# Patient Record
Sex: Female | Born: 1975 | Race: White | Hispanic: No | Smoking: Current every day smoker
Health system: Southern US, Community
[De-identification: ages and names within clinical notes are randomized; demographics above are authoritative.]

## PROBLEM LIST (undated history)

## (undated) DIAGNOSIS — W3400XA Accidental discharge from unspecified firearms or gun, initial encounter: Secondary | ICD-10-CM

## (undated) DIAGNOSIS — R87619 Unspecified abnormal cytological findings in specimens from cervix uteri: Secondary | ICD-10-CM

## (undated) DIAGNOSIS — F329 Major depressive disorder, single episode, unspecified: Secondary | ICD-10-CM

## (undated) DIAGNOSIS — G8929 Other chronic pain: Secondary | ICD-10-CM

## (undated) DIAGNOSIS — F32A Depression, unspecified: Secondary | ICD-10-CM

## (undated) DIAGNOSIS — F419 Anxiety disorder, unspecified: Secondary | ICD-10-CM

## (undated) HISTORY — PX: MINOR AMPUTATION OF DIGIT: SHX6242

## (undated) HISTORY — PX: APPENDECTOMY: SHX54

## (undated) HISTORY — PX: OTHER SURGICAL HISTORY: SHX169

---

## 2011-05-05 ENCOUNTER — Emergency Department (HOSPITAL_BASED_OUTPATIENT_CLINIC_OR_DEPARTMENT_OTHER)
Admission: EM | Admit: 2011-05-05 | Discharge: 2011-05-05 | Disposition: A | Discharge status: Home / Self Care | Payer: Self-pay | Attending: Emergency Medicine | Admitting: Emergency Medicine

## 2011-05-05 DIAGNOSIS — B86 Scabies: Secondary | ICD-10-CM | POA: Insufficient documentation

## 2011-05-05 DIAGNOSIS — F17200 Nicotine dependence, unspecified, uncomplicated: Secondary | ICD-10-CM | POA: Insufficient documentation

## 2012-07-05 ENCOUNTER — Emergency Department (HOSPITAL_BASED_OUTPATIENT_CLINIC_OR_DEPARTMENT_OTHER): Payer: Self-pay

## 2012-07-05 ENCOUNTER — Emergency Department (HOSPITAL_BASED_OUTPATIENT_CLINIC_OR_DEPARTMENT_OTHER)
Admission: EM | Admit: 2012-07-05 | Discharge: 2012-07-05 | Disposition: A | Discharge status: Home / Self Care | Payer: Self-pay | Attending: Emergency Medicine | Admitting: Emergency Medicine

## 2012-07-05 ENCOUNTER — Encounter (HOSPITAL_BASED_OUTPATIENT_CLINIC_OR_DEPARTMENT_OTHER): Payer: Self-pay

## 2012-07-05 DIAGNOSIS — R109 Unspecified abdominal pain: Secondary | ICD-10-CM

## 2012-07-05 DIAGNOSIS — A499 Bacterial infection, unspecified: Secondary | ICD-10-CM | POA: Insufficient documentation

## 2012-07-05 DIAGNOSIS — Z9089 Acquired absence of other organs: Secondary | ICD-10-CM | POA: Insufficient documentation

## 2012-07-05 DIAGNOSIS — N760 Acute vaginitis: Secondary | ICD-10-CM | POA: Insufficient documentation

## 2012-07-05 DIAGNOSIS — R102 Pelvic and perineal pain: Secondary | ICD-10-CM

## 2012-07-05 DIAGNOSIS — B9689 Other specified bacterial agents as the cause of diseases classified elsewhere: Secondary | ICD-10-CM | POA: Insufficient documentation

## 2012-07-05 DIAGNOSIS — F17200 Nicotine dependence, unspecified, uncomplicated: Secondary | ICD-10-CM | POA: Insufficient documentation

## 2012-07-05 HISTORY — DX: Unspecified abnormal cytological findings in specimens from cervix uteri: R87.619

## 2012-07-05 LAB — CBC WITH DIFFERENTIAL/PLATELET
Eosinophils Absolute: 0.5 10*3/uL (ref 0.0–0.7)
Eosinophils Relative: 7 % — ABNORMAL HIGH (ref 0–5)
Hemoglobin: 12.3 g/dL (ref 12.0–15.0)
Lymphocytes Relative: 34 % (ref 12–46)
Lymphs Abs: 2.6 10*3/uL (ref 0.7–4.0)
MCH: 33 pg (ref 26.0–34.0)
MCV: 95.2 fL (ref 78.0–100.0)
Monocytes Relative: 8 % (ref 3–12)
Neutrophils Relative %: 51 % (ref 43–77)
RBC: 3.73 MIL/uL — ABNORMAL LOW (ref 3.87–5.11)
WBC: 7.5 10*3/uL (ref 4.0–10.5)

## 2012-07-05 LAB — PREGNANCY, URINE: Preg Test, Ur: NEGATIVE

## 2012-07-05 LAB — COMPREHENSIVE METABOLIC PANEL
Alkaline Phosphatase: 56 U/L (ref 39–117)
BUN: 9 mg/dL (ref 6–23)
CO2: 24 mEq/L (ref 19–32)
GFR calc Af Amer: >90 mL/min (ref >90)
GFR calc non Af Amer: >90 mL/min (ref >90)
Glucose, Bld: 88 mg/dL (ref 70–99)
Potassium: 3.4 mEq/L — ABNORMAL LOW (ref 3.5–5.1)
Total Protein: 6.9 g/dL (ref 6.0–8.3)

## 2012-07-05 LAB — URINALYSIS, ROUTINE W REFLEX MICROSCOPIC
Leukocytes, UA: NEGATIVE
Nitrite: NEGATIVE
Protein, ur: NEGATIVE mg/dL
Urobilinogen, UA: 1 mg/dL (ref 0.0–1.0)

## 2012-07-05 LAB — WET PREP, GENITAL: Yeast Wet Prep HPF POC: NONE SEEN

## 2012-07-05 MED ORDER — MORPHINE SULFATE 4 MG/ML IJ SOLN
4.0000 mg | Freq: Once | INTRAMUSCULAR | Status: AC
  Administered 2012-07-05: 4 mg via INTRAVENOUS
  Filled 2012-07-05: qty 1

## 2012-07-05 MED ORDER — MORPHINE SULFATE 4 MG/ML IJ SOLN
4.0000 mg | Freq: Once | INTRAMUSCULAR | Status: AC
  Administered 2012-07-05: 4 mg via INTRAMUSCULAR
  Filled 2012-07-05: qty 1

## 2012-07-05 MED ORDER — IOHEXOL 300 MG/ML  SOLN
100.0000 mL | Freq: Once | INTRAMUSCULAR | Status: AC | PRN
  Administered 2012-07-05: 100 mL via INTRAVENOUS

## 2012-07-05 MED ORDER — IBUPROFEN 600 MG PO TABS: 600.0000 mg | ORAL_TABLET | Freq: Four times a day (QID) | ORAL | Status: AC | PRN

## 2012-07-05 MED ORDER — METRONIDAZOLE 500 MG PO TABS: 500.0000 mg | ORAL_TABLET | Freq: Two times a day (BID) | ORAL | Status: AC

## 2012-07-05 MED ORDER — SODIUM CHLORIDE 0.9 % IV BOLUS (SEPSIS)
1000.0000 mL | Freq: Once | INTRAVENOUS | Status: AC
  Administered 2012-07-05: 1000 mL via INTRAVENOUS

## 2012-07-05 MED ORDER — ONDANSETRON 4 MG PO TBDP
4.0000 mg | ORAL_TABLET | Freq: Once | ORAL | Status: AC
  Administered 2012-07-05: 4 mg via ORAL
  Filled 2012-07-05: qty 1

## 2012-07-05 MED ORDER — IOHEXOL 300 MG/ML  SOLN: 20.0000 mL | INTRAMUSCULAR | Status: DC

## 2012-07-05 MED ORDER — OXYCODONE-ACETAMINOPHEN 5-325 MG PO TABS: 2.0000 | ORAL_TABLET | ORAL | Status: AC | PRN

## 2012-07-05 NOTE — ED Notes (Addendum)
Hx of abnormal cervical cells, "four freezes, one lube, scraped of my uterus"-was advised to have pap 2/yr-did not have pap x 5 yrs-last pap Dec 2012 at [facility] in [place]-was not contacted with reports-called today due to pelvic pain-was not given results but advised to go to ED-pt reports intermittent pelvic-worse since this am-nausea

## 2012-07-05 NOTE — ED Provider Notes (Addendum)
History     CSN: [id number]  Arrival date & time 07/05/12  1150   First MD Initiated Contact with Patient 07/05/12 1203      Chief Complaint  Patient presents with  . Pelvic Pain    (Consider location/radiation/quality/duration/timing/severity/associated sxs/prior treatment) HPI  36yoF pw lower abdominal, lower and diffuse, has been constant since awakening. C/o 8/10 pain which she describes as pressure "feels like it's coming out". Feels "almost like a contraction except it's constant".  Baseline vaginal discharge without odor or itching.  H/o abnormal pap smears last LEEP at 36yo per patient. Has not f/u since Dec 2012 (states she could not obtain the result of her pap smear done at that time). Denies vaginal bleeding. Denies hematuria/dysuria/freq/urgency. Denies back pain. +nausea only when pain is severe. NBNB emesis x 2.  Denies fever +chills. Diarrhea began yesterday, loose stool. No unintentional weight loss. H/o trichomoniasis in the past. In a monogamous relationship.  Abdominal surgeries include appendectomy, removal of uterine cyst (lap per pt)   ED Notes, ED Provider Notes from 07/05/12 0000 to 07/05/12 12:09:02       Angeline Slim, RN 07/05/2012 12:06      Hx of abnormal cervical cells, "four freezes, one lube, scraped the whole all of my uterus"-was advised to have pap 2/yr-did not have pap x 5 yrs-last pap Dec 2012 at [facility] in [place]-was not contacted with reports-called today due to pelvic pain-was not given results but advised to go to ED-pt reports intermittent pelvic-worse since this am-nausea    Past Medical History  Diagnosis Date  . Abnormal cervical Papanicolaou smear     Past Surgical History  Procedure Date  . Uterine cyst   . Appendectomy     No family history on file.  History  Substance Use Topics  . Smoking status: Current Everyday Smoker  . Smokeless tobacco: Not on file  . Alcohol Use: No    OB History    Grav Para Term Preterm Abortions TAB  SAB Ect Mult Living                  Review of Systems  All other systems reviewed and are negative.  except as noted HPI   Allergies  Review of patient's allergies indicates no known allergies.  Home Medications   Current Outpatient Rx  Name Route Sig Dispense Refill  . GOODY HEADACHE PO Oral Take 1 packet by mouth daily as needed. Patient used this medication for her headaches.    . IBUPROFEN 600 MG PO TABS Oral Take 1 tablet (600 mg total) by mouth every 6 (six) hours as needed for pain. 30 tablet 0  . METRONIDAZOLE 500 MG PO TABS Oral Take 1 tablet (500 mg total) by mouth 2 (two) times daily. 14 tablet 0  . OXYCODONE-ACETAMINOPHEN 5-325 MG PO TABS Oral Take 2 tablets by mouth every 4 (four) hours as needed for pain. 6 tablet 0    BP 119/84  Pulse 66  Temp 98 F (36.7 C) (Oral)  Resp 16  Ht 5\' 9"  (1.753 m)  Wt 190 lb (86.183 kg)  BMI 28.06 kg/m2  SpO2 100%  LMP 06/15/2012  Physical Exam  Nursing note and vitals reviewed. Constitutional: She is oriented to person, place, and time. She appears well-developed.  HENT:  Head: Atraumatic.       Mm dry  Eyes: Conjunctivae and EOM are normal. Pupils are equal, round, and reactive to light.  Neck: Normal range of motion. Neck  supple.  Cardiovascular: Normal rate, regular rhythm, normal heart sounds and intact distal pulses.   Pulmonary/Chest: Effort normal and breath sounds normal. No respiratory distress. She has no wheezes. She has no rales.  Abdominal: Soft. She exhibits no distension. There is tenderness. There is no rebound and no guarding.       +diffuse lower abd ttp > RLQ  Genitourinary:       Min clear /white vaginal discharge No CMT Min R adnexal ttp Two white IUD strings visualized No uterine prolapse in supine position  Musculoskeletal: Normal range of motion.  Neurological: She is alert and oriented to person, place, and time.  Skin: Skin is warm and dry. No rash noted.  Psychiatric: She has a normal  mood and affect.    ED Course  Procedures (including critical care time)  Labs Reviewed  URINALYSIS, ROUTINE W REFLEX MICROSCOPIC - Abnormal; Notable for the following:    Color, Urine AMBER (*)  BIOCHEMICALS MAY BE AFFECTED BY COLOR   APPearance CLOUDY (*)     Bilirubin Urine SMALL (*)     Ketones, ur 15 (*)     All other components within normal limits  WET PREP, GENITAL - Abnormal; Notable for the following:    Clue Cells Wet Prep HPF POC FEW (*)     WBC, Wet Prep HPF POC TOO NUMEROUS TO COUNT (*)     All other components within normal limits  CBC WITH DIFFERENTIAL - Abnormal; Notable for the following:    RBC 3.73 (*)     HCT 35.5 (*)     Eosinophils Relative 7 (*)     All other components within normal limits  COMPREHENSIVE METABOLIC PANEL - Abnormal; Notable for the following:    Potassium 3.4 (*)     All other components within normal limits  PREGNANCY, URINE  GC/CHLAMYDIA PROBE AMP, GENITAL   US Transvaginal Non-ob  07/05/2012  *RADIOLOGY REPORT*  Clinical Data: Pelvic pain.  IUD.  History of ovarian cysts.  TRANSABDOMINAL AND TRANSVAGINAL ULTRASOUND OF PELVIS  Technique:  Both transabdominal and transvaginal ultrasound examinations of the pelvis were performed including evaluation of the uterus, ovaries, adnexal regions, and pelvic cul-de-sac.  Comparison: None.  Findings:  Uterus: 8.3 x 4.0 x 4.9 cm.  Normal echotexture.  No focal abnormality.  Endometrium: 4 mm in thickness.  IUD noted within the endometrial canal.  Right Ovary: Not visualized.  No adnexal masses seen.  Left Ovary: 2.6 x 1.6 x 1.9 cm. Normal size and echotexture.  No adnexal masses.  Other Findings:  No free fluid.  IMPRESSION: IUD in place within the endometrial canal.  Nonvisualization of the right ovary.  No adnexal masses noted bilaterally.  No acute findings.  Original Report Authenticated By: Cyndie Chime, M.D.   US Pelvis Complete  07/05/2012  *RADIOLOGY REPORT*  Clinical Data: Pelvic pain.  IUD.   History of ovarian cysts.  TRANSABDOMINAL AND TRANSVAGINAL ULTRASOUND OF PELVIS  Technique:  Both transabdominal and transvaginal ultrasound examinations of the pelvis were performed including evaluation of the uterus, ovaries, adnexal regions, and pelvic cul-de-sac.  Comparison: None.  Findings:  Uterus: 8.3 x 4.0 x 4.9 cm.  Normal echotexture.  No focal abnormality.  Endometrium: 4 mm in thickness.  IUD noted within the endometrial canal.  Right Ovary: Not visualized.  No adnexal masses seen.  Left Ovary: 2.6 x 1.6 x 1.9 cm. Normal size and echotexture.  No adnexal masses.  Other Findings:  No free fluid.  IMPRESSION: IUD in place within the endometrial canal.  Nonvisualization of the right ovary.  No adnexal masses noted bilaterally.  No acute findings.  Original Report Authenticated By: Cyndie Chime, M.D.   Ct Abdomen Pelvis W Contrast  07/05/2012  *RADIOLOGY REPORT*  Clinical Data: 36 year old female with lower abdominal pain nausea and vomiting.  CT ABDOMEN AND PELVIS WITH CONTRAST  Technique:  Multidetector CT imaging of the abdomen and pelvis was performed following the standard protocol during bolus administration of intravenous contrast.  Contrast: OMNIPAQUE IOHEXOL 300 MG/ML  SOLN  Comparison: Pelvic ultrasound from the same day.  Findings: Minor scarring or atelectasis at the lung bases.  No pericardial or pleural effusion.  Minimal No acute osseous abnormality identified.  No pelvic free fluid.  IUD in place with no adverse features. Uterus and adnexa within normal limits otherwise.  Bladder decompressed.  Negative distal colon.  Negative proximal colon.  Normal terminal ileum.  Appendix not identified and favored to be surgically absent.  Oral contrast has not reached the ileum.  No dilated small bowel.  Stomach distended with contrast, otherwise within normal limits.  Negative duodenum, liver, gallbladder, spleen, pancreas and adrenal glands.  Portal venous system within normal limits.   There is some atherosclerosis of the distal aorta and iliac arteries, but major arterial structures are patent.  Kidneys are within normal limits; there are small caliber venous collaterals in the left pararenal space which probably are insignificant.  No abdominal free fluid. No lymphadenopathy identified.  IMPRESSION: No acute or inflammatory findings.  Original Report Authenticated By: Ulla Potash III, M.D.    1. Abdominal pain   2. Pelvic pain in female   3. Bacterial vaginosis     MDM  Pw lower abdominal pain >RLQ. She has min R adnexal ttp and min vaginal discharge. No prolapse.  H/o appendicitis s/p appendectomy, doubt stump appendicitis. Possible colitis given point tenderness and diarrhea. U/S pelvis as above with IUD in good placement, no large TOA visualized. No large cancerous lesion. I have low suspicion of torsion based on clinical picture but they were unable to visualize R ovary. Possible cyst. Pt denies oophorectomy. Doubt PID without CMT. She does cont to have significant RLQ point tenderness requiring narcotic pain medication. Labs unremarkable except clue cells, which we will treat with flagyl. IVF, zofran, pain control. CT AP pending.    Old medical records obtained. PAP 11/2011 negative for HPV. Patient aware that she will need to f/u for PAP smear 11/2012 as previously suggested by her providers. Aware of above results and that torsion cannot be fully ruled out. I did speak with the radiologist who states he could visualize her Rt ovary high in the pelvis and no inflammatory changes associated. She is having no pain at this time and would like to go home. No EMC precluding discharge at this time. Given Precautions for return. PMD f/u.   Forbes Cellar, MD 07/05/12 1610  Forbes Cellar, MD 07/05/12 9604  Forbes Cellar, MD 07/05/12 5409

## 2012-07-05 NOTE — ED Notes (Signed)
Spoke with [facility] medical records-requested pt med record from Dec 2012-release of med records faxed

## 2012-09-24 ENCOUNTER — Emergency Department (HOSPITAL_BASED_OUTPATIENT_CLINIC_OR_DEPARTMENT_OTHER)
Admission: EM | Admit: 2012-09-24 | Discharge: 2012-09-24 | Disposition: A | Discharge status: Home / Self Care | Payer: Self-pay | Attending: Emergency Medicine | Admitting: Emergency Medicine

## 2012-09-24 ENCOUNTER — Encounter (HOSPITAL_BASED_OUTPATIENT_CLINIC_OR_DEPARTMENT_OTHER): Payer: Self-pay | Admitting: *Deleted

## 2012-09-24 DIAGNOSIS — L02214 Cutaneous abscess of groin: Secondary | ICD-10-CM

## 2012-09-24 DIAGNOSIS — L03319 Cellulitis of trunk, unspecified: Secondary | ICD-10-CM | POA: Insufficient documentation

## 2012-09-24 DIAGNOSIS — F17200 Nicotine dependence, unspecified, uncomplicated: Secondary | ICD-10-CM | POA: Insufficient documentation

## 2012-09-24 DIAGNOSIS — L02219 Cutaneous abscess of trunk, unspecified: Secondary | ICD-10-CM | POA: Insufficient documentation

## 2012-09-24 MED ORDER — DOXYCYCLINE HYCLATE 100 MG PO TABS
100.0000 mg | ORAL_TABLET | Freq: Once | ORAL | Status: AC
Start: 2012-09-24 — End: 2012-09-24
  Administered 2012-09-24: 100 mg via ORAL
  Filled 2012-09-24: qty 1

## 2012-09-24 MED ORDER — HYDROCODONE-ACETAMINOPHEN 5-325 MG PO TABS: 2.0000 | ORAL_TABLET | ORAL | Status: AC | PRN

## 2012-09-24 MED ORDER — LIDOCAINE HCL 2 % IJ SOLN
INTRAMUSCULAR | Status: AC
  Filled 2012-09-24: qty 20

## 2012-09-24 MED ORDER — CEPHALEXIN 500 MG PO CAPS: 500.0000 mg | ORAL_CAPSULE | Freq: Four times a day (QID) | ORAL | Status: DC

## 2012-09-24 MED ORDER — HYDROCODONE-ACETAMINOPHEN 5-325 MG PO TABS
2.0000 | ORAL_TABLET | Freq: Once | ORAL | Status: AC
  Administered 2012-09-24: 2 via ORAL
  Filled 2012-09-24: qty 2

## 2012-09-24 MED ORDER — CEPHALEXIN 250 MG PO CAPS
250.0000 mg | ORAL_CAPSULE | Freq: Once | ORAL | Status: AC
  Administered 2012-09-24: 250 mg via ORAL
  Filled 2012-09-24: qty 1

## 2012-09-24 MED ORDER — DOXYCYCLINE HYCLATE 100 MG PO CAPS: 100.0000 mg | ORAL_CAPSULE | Freq: Two times a day (BID) | ORAL | Status: DC

## 2012-09-24 NOTE — ED Notes (Signed)
Instructions for dressing and care given by Tresa Endo EMT, patient verbalized understanding

## 2012-09-24 NOTE — ED Provider Notes (Signed)
Medical screening examination/treatment/procedure(s) were performed by non-physician practitioner and as supervising physician I was immediately available for consultation/collaboration.   Arzella Rehmann, MD 09/24/12 1513 

## 2012-09-24 NOTE — ED Notes (Signed)
abcess pelvic region, has grown worse over the past three weeks, red swollen

## 2012-09-24 NOTE — ED Provider Notes (Signed)
History     CSN: [id number]  Arrival date & time 09/24/12  1053   First MD Initiated Contact with Patient 09/24/12 1223      Chief Complaint  Patient presents with  . Abscess    (Consider location/radiation/quality/duration/timing/severity/associated sxs/prior treatment) Patient is a 36 y.o. female presenting with abscess. The history is provided by the patient. No language interpreter was used.  Abscess  This is a new problem. The onset was gradual. The problem occurs occasionally. The problem has been unchanged. The abscess is present on the genitalia. The problem is severe. There were no sick contacts.   Pt complains of a swollen area to left groin.   Pt reports she tried to drain area with a pin Past Medical History  Diagnosis Date  . Abnormal cervical Papanicolaou smear     Past Surgical History  Procedure Date  . Uterine cyst   . Appendectomy     No family history on file.  History  Substance Use Topics  . Smoking status: Current Every Day Smoker  . Smokeless tobacco: Not on file  . Alcohol Use: No    OB History    Grav Para Term Preterm Abortions TAB SAB Ect Mult Living                  Review of Systems  All other systems reviewed and are negative.    Allergies  Review of patient's allergies indicates no known allergies.  Home Medications   Current Outpatient Rx  Name Route Sig Dispense Refill  . GOODY HEADACHE PO Oral Take 1 packet by mouth daily as needed. Patient used this medication for her headaches.      BP 139/68  Pulse 85  Temp 98.2 F (36.8 C)  Resp 20  SpO2 98%  Physical Exam  Constitutional: She is oriented to person, place, and time. She appears well-developed and well-nourished.  Genitourinary:       Tender swollen left labial area,  Not a bartolins,  Pointing area skin,   (probable skin abscess)   Musculoskeletal: She exhibits tenderness.  Neurological: She is alert and oriented to person, place, and time. She has normal  reflexes.  Skin: There is erythema.  Psychiatric: She has a normal mood and affect.    ED Course  INCISION AND DRAINAGE Date/Time: 09/24/2012 1:01 PM Performed by: Elson Areas Authorized by: Elson Areas Consent: Verbal consent not obtained. Risks and benefits: risks, benefits and alternatives were discussed Consent given by: patient Patient understanding: patient states understanding of the procedure being performed Patient identity confirmed: verbally with patient Type: abscess Anesthesia: local infiltration Patient sedated: no Scalpel size: 11 Incision type: single straight Complexity: simple Wound treatment: wound left open Packing material: 1/4 in iodoform gauze   (including critical care time)  Labs Reviewed - No data to display No results found.   1. Abscess of left groin       MDM  Pt given doxy and keflex here.   Pt given rx for doxy and kefles,  Hydrocodone for pain.    Pt advised to recheck here in 2 day        Elson Areas, [place] 09/24/12 1302  Lonia Skinner [place], [place] 09/24/12 [phone]

## 2014-05-30 ENCOUNTER — Emergency Department (HOSPITAL_BASED_OUTPATIENT_CLINIC_OR_DEPARTMENT_OTHER): Payer: Self-pay

## 2014-05-30 ENCOUNTER — Encounter (HOSPITAL_BASED_OUTPATIENT_CLINIC_OR_DEPARTMENT_OTHER): Payer: Self-pay | Admitting: Emergency Medicine

## 2014-05-30 ENCOUNTER — Emergency Department (HOSPITAL_BASED_OUTPATIENT_CLINIC_OR_DEPARTMENT_OTHER)
Admission: EM | Admit: 2014-05-30 | Discharge: 2014-05-30 | Disposition: A | Discharge status: Home / Self Care | Payer: Self-pay | Attending: Emergency Medicine | Admitting: Emergency Medicine

## 2014-05-30 DIAGNOSIS — J209 Acute bronchitis, unspecified: Secondary | ICD-10-CM | POA: Insufficient documentation

## 2014-05-30 DIAGNOSIS — F17200 Nicotine dependence, unspecified, uncomplicated: Secondary | ICD-10-CM | POA: Insufficient documentation

## 2014-05-30 DIAGNOSIS — Z792 Long term (current) use of antibiotics: Secondary | ICD-10-CM | POA: Insufficient documentation

## 2014-05-30 DIAGNOSIS — H109 Unspecified conjunctivitis: Secondary | ICD-10-CM | POA: Insufficient documentation

## 2014-05-30 DIAGNOSIS — R079 Chest pain, unspecified: Secondary | ICD-10-CM | POA: Insufficient documentation

## 2014-05-30 DIAGNOSIS — J40 Bronchitis, not specified as acute or chronic: Secondary | ICD-10-CM

## 2014-05-30 MED ORDER — POLYMYXIN B-TRIMETHOPRIM 10000-0.1 UNIT/ML-% OP SOLN: 1.0000 [drp] | OPHTHALMIC | Status: DC

## 2014-05-30 MED ORDER — HYDROCODONE-HOMATROPINE 5-1.5 MG/5ML PO SYRP: 5.0000 mL | ORAL_SOLUTION | Freq: Four times a day (QID) | ORAL | Status: DC | PRN

## 2014-05-30 MED ORDER — ALBUTEROL SULFATE HFA 108 (90 BASE) MCG/ACT IN AERS
1.0000 | INHALATION_SPRAY | RESPIRATORY_TRACT | Status: DC | PRN
  Administered 2014-05-30: 2 via RESPIRATORY_TRACT
  Filled 2014-05-30: qty 6.7

## 2014-05-30 NOTE — ED Notes (Signed)
Pt amb to room 5 with quick steady gait in nad. Pt reports "lots of gunk in my eyes" x 2 days, cough producing moderate yellow sputum x 1 week.

## 2014-05-30 NOTE — Discharge Instructions (Signed)
Bacterial Conjunctivitis  Bacterial conjunctivitis (commonly called pink eye) is redness, soreness, or puffiness (inflammation) of the white part of your eye. It is caused by a germ called bacteria. These germs can easily spread from person to person (contagious). Your eye often will become red or pink. Your eye may also become irritated, watery, or have a thick discharge.   HOME CARE   · Apply a cool, clean washcloth over closed eyelids. Do this for 10 20 minutes, 3 4 times a day while you have pain.  · Gently wipe away any fluid coming from the eye with a warm, wet washcloth or cotton ball.  · Wash your hands often with soap and water. Use paper towels to dry your hands.  · Do not share towels or washcloths.  · Change or wash your pillowcase every day.  · Do not use eye makeup until the infection is gone.  · Do not use machines or drive if your vision is blurry.  · Stop using contact lenses. Do not use them again until your doctor says it is okay.  · Do not touch the tip of the eye drop bottle or medicine tube with your fingers when you put medicine on the eye.  GET HELP RIGHT AWAY IF:   · Your eye is not better after 3 days of starting your medicine.  · You have a yellowish fluid coming out of the eye.  · You have more pain in the eye.  · Your eye redness is spreading.  · Your vision becomes blurry.  · You have a fever or lasting symptoms for more than 2-3 days.  · You have a fever and your symptoms suddenly get worse.  · You have pain in the face.  · Your face gets red or puffy (swollen).  MAKE SURE YOU:   · Understand these instructions.  · Will watch this condition.  · Will get help right away if you are not doing well or get worse.  Document Released: 09/14/2008 Document Revised: 11/22/2012 Document Reviewed: 09/14/2008  Exit[employer]® Patient Information ©2014 [employer].

## 2014-05-30 NOTE — ED Provider Notes (Signed)
CSN: [id number]     Arrival date & time 05/30/14  4696 History   First MD Initiated Contact with Patient 05/30/14 [phone]     Chief Complaint  Patient presents with  . Cough  . Eye Problem     (Consider location/radiation/quality/duration/timing/severity/associated sxs/prior Treatment) Patient is a 38 y.o. female presenting with cough and eye problem. The history is provided by the patient.  Cough Cough characteristics:  Productive and hacking Sputum characteristics:  Yellow and white Severity:  Moderate Onset quality:  Gradual Duration:  1 week Timing:  Constant Progression:  Waxing and waning Chronicity:  New Smoker: yes   Context: upper respiratory infection   Context: not sick contacts   Relieved by:  Nothing Worsened by:  Lying down Ineffective treatments:  Decongestant Associated symptoms: chest pain, eye discharge, rhinorrhea and wheezing   Associated symptoms: no chills, no fever, no headaches, no myalgias, no rash, no shortness of breath and no sore throat   Associated symptoms comment:  Also 2 days ago left eye started to drain and become matted and red.  No contact use Risk factors: no recent infection   Eye Problem Associated symptoms: discharge, itching and redness   Associated symptoms: no headaches and no photophobia     Past Medical History  Diagnosis Date  . Abnormal cervical Papanicolaou smear    Past Surgical History  Procedure Laterality Date  . Uterine cyst    . Appendectomy     History reviewed. No pertinent family history. History  Substance Use Topics  . Smoking status: Current Every Day Smoker  . Smokeless tobacco: Not on file  . Alcohol Use: No   OB History   Grav Para Term Preterm Abortions TAB SAB Ect Mult Living                 Review of Systems  Constitutional: Negative for fever and chills.  HENT: Positive for rhinorrhea. Negative for sore throat.   Eyes: Positive for pain, discharge, redness and itching. Negative for photophobia  and visual disturbance.  Respiratory: Positive for cough and wheezing. Negative for shortness of breath.   Cardiovascular: Positive for chest pain.  Musculoskeletal: Negative for myalgias.  Skin: Negative for rash.  Neurological: Negative for headaches.  All other systems reviewed and are negative.     Allergies  Review of patient's allergies indicates no known allergies.  Home Medications   Prior to Admission medications   Medication Sig Start Date End Date Taking? Authorizing Provider  Aspirin-Acetaminophen-Caffeine (GOODY HEADACHE PO) Take 1 packet by mouth daily as needed. Patient used this medication for her headaches.    Historical Provider, MD  cephALEXin (KEFLEX) 500 MG capsule Take 1 capsule (500 mg total) by mouth 4 (four) times daily. 09/24/12   Elson Areas, PA-C  doxycycline (VIBRAMYCIN) 100 MG capsule Take 1 capsule (100 mg total) by mouth 2 (two) times daily. 09/24/12   Elson Areas, PA-C   BP 137/74  Pulse 84  Temp(Src) 97.9 F (36.6 C) (Oral)  Resp 16  Ht 5\' 9"  (1.753 m)  Wt 190 lb (86.183 kg)  BMI 28.05 kg/m2  SpO2 100%  LMP 05/03/2014 Physical Exam  Nursing note and vitals reviewed. Constitutional: She is oriented to person, place, and time. She appears well-developed and well-nourished. No distress.  HENT:  Head: Normocephalic and atraumatic.  Mouth/Throat: Oropharynx is clear and moist.  Eyes: EOM are normal. Pupils are equal, round, and reactive to light. Left eye exhibits discharge. Left conjunctiva is injected.  Neck: Normal range of motion. Neck supple.  Cardiovascular: Normal rate, regular rhythm and intact distal pulses.   No murmur heard. Pulmonary/Chest: Effort normal and breath sounds normal. No respiratory distress. She has no wheezes. She has no rales. She exhibits tenderness. She exhibits no crepitus.    Abdominal: Soft. She exhibits no distension. There is no tenderness. There is no rebound and no guarding.  Musculoskeletal: Normal  range of motion. She exhibits no edema and no tenderness.  Neurological: She is alert and oriented to person, place, and time.  Skin: Skin is warm and dry. No rash noted. No erythema.  Psychiatric: She has a normal mood and affect. Her behavior is normal.    ED Course  Procedures (including critical care time) Labs Review Labs Reviewed - No data to display  Imaging Review Dg Chest 2 View  05/30/2014   CLINICAL DATA:  One week history of cough ; history of tobacco use.  EXAM: CHEST  2 VIEW  COMPARISON:  PA and lateral chest x-ray of September 25, 2013.  FINDINGS: The lungs are well-expanded and clear. The heart and mediastinal structures are normal. There is no pleural effusion. The bony structures are unremarkable.  IMPRESSION: There is no acute cardiopulmonary abnormality.   Electronically Signed   By: David  [place]   On: 05/30/2014 10:16     EKG Interpretation None      MDM   Final diagnoses:  Bronchitis  Conjunctivitis    Pt with symptoms consistent with viral URI/bronchitis.  Also has conjuncitivits.  Well appearing here.  No signs of breathing difficulty  No signs of pharyngitis, otitis or abnormal abdominal findings.   CXR wnl and pt to return with any further problems. Will treat with polytrim gtts, albuterol and cough suppressant.     Gwyneth SproutWhitney Nayeli Calvert, MD 05/30/14 1025

## 2014-05-30 NOTE — ED Notes (Signed)
MD at bedside. 

## 2015-03-05 ENCOUNTER — Emergency Department (HOSPITAL_BASED_OUTPATIENT_CLINIC_OR_DEPARTMENT_OTHER): Payer: Self-pay

## 2015-03-05 ENCOUNTER — Encounter (HOSPITAL_BASED_OUTPATIENT_CLINIC_OR_DEPARTMENT_OTHER): Payer: Self-pay | Admitting: *Deleted

## 2015-03-05 ENCOUNTER — Emergency Department (HOSPITAL_BASED_OUTPATIENT_CLINIC_OR_DEPARTMENT_OTHER)
Admission: EM | Admit: 2015-03-05 | Discharge: 2015-03-05 | Disposition: A | Discharge status: Home / Self Care | Payer: Self-pay | Attending: Emergency Medicine | Admitting: Emergency Medicine

## 2015-03-05 DIAGNOSIS — R05 Cough: Secondary | ICD-10-CM

## 2015-03-05 DIAGNOSIS — Z792 Long term (current) use of antibiotics: Secondary | ICD-10-CM | POA: Insufficient documentation

## 2015-03-05 DIAGNOSIS — W19XXXA Unspecified fall, initial encounter: Secondary | ICD-10-CM

## 2015-03-05 DIAGNOSIS — Y9389 Activity, other specified: Secondary | ICD-10-CM | POA: Insufficient documentation

## 2015-03-05 DIAGNOSIS — Y9289 Other specified places as the place of occurrence of the external cause: Secondary | ICD-10-CM | POA: Insufficient documentation

## 2015-03-05 DIAGNOSIS — J159 Unspecified bacterial pneumonia: Secondary | ICD-10-CM | POA: Insufficient documentation

## 2015-03-05 DIAGNOSIS — S3992XA Unspecified injury of lower back, initial encounter: Secondary | ICD-10-CM | POA: Insufficient documentation

## 2015-03-05 DIAGNOSIS — Z720 Tobacco use: Secondary | ICD-10-CM | POA: Insufficient documentation

## 2015-03-05 DIAGNOSIS — R059 Cough, unspecified: Secondary | ICD-10-CM

## 2015-03-05 DIAGNOSIS — J189 Pneumonia, unspecified organism: Secondary | ICD-10-CM

## 2015-03-05 DIAGNOSIS — Z79899 Other long term (current) drug therapy: Secondary | ICD-10-CM | POA: Insufficient documentation

## 2015-03-05 DIAGNOSIS — Y998 Other external cause status: Secondary | ICD-10-CM | POA: Insufficient documentation

## 2015-03-05 MED ORDER — LEVOFLOXACIN 500 MG PO TABS
500.0000 mg | ORAL_TABLET | Freq: Once | ORAL | Status: AC
  Administered 2015-03-05: 500 mg via ORAL
  Filled 2015-03-05: qty 1

## 2015-03-05 MED ORDER — BENZONATATE 100 MG PO CAPS: 100.0000 mg | ORAL_CAPSULE | Freq: Three times a day (TID) | ORAL | Status: DC | PRN

## 2015-03-05 MED ORDER — LEVOFLOXACIN 500 MG PO TABS: 500.0000 mg | ORAL_TABLET | Freq: Every day | ORAL | Status: DC

## 2015-03-05 MED ORDER — HYDROCODONE-ACETAMINOPHEN 5-325 MG PO TABS: 1.0000 | ORAL_TABLET | ORAL | Status: DC | PRN

## 2015-03-05 MED ORDER — ONDANSETRON 4 MG PO TBDP
4.0000 mg | ORAL_TABLET | Freq: Once | ORAL | Status: AC
  Administered 2015-03-05: 4 mg via ORAL
  Filled 2015-03-05: qty 1

## 2015-03-05 MED ORDER — IBUPROFEN 800 MG PO TABS
800.0000 mg | ORAL_TABLET | Freq: Once | ORAL | Status: AC
  Administered 2015-03-05: 800 mg via ORAL
  Filled 2015-03-05: qty 1

## 2015-03-05 MED ORDER — PROMETHAZINE HCL 25 MG PO TABS: 25.0000 mg | ORAL_TABLET | Freq: Four times a day (QID) | ORAL | Status: DC | PRN

## 2015-03-05 MED ORDER — HYDROCODONE-ACETAMINOPHEN 5-325 MG PO TABS
2.0000 | ORAL_TABLET | Freq: Once | ORAL | Status: AC
  Administered 2015-03-05: 2 via ORAL
  Filled 2015-03-05: qty 2

## 2015-03-05 MED ORDER — IBUPROFEN 800 MG PO TABS: 800.0000 mg | ORAL_TABLET | Freq: Three times a day (TID) | ORAL | Status: DC | PRN
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
## 2015-03-05 NOTE — ED Notes (Signed)
Crackers and ginger ale offered
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
## 2015-03-05 NOTE — ED Notes (Signed)
Patient returns from X ray.

## 2015-03-05 NOTE — Discharge Instructions (Signed)

## 2015-06-13 ENCOUNTER — Encounter (HOSPITAL_BASED_OUTPATIENT_CLINIC_OR_DEPARTMENT_OTHER): Payer: Self-pay

## 2015-06-13 ENCOUNTER — Emergency Department (HOSPITAL_BASED_OUTPATIENT_CLINIC_OR_DEPARTMENT_OTHER)
Admission: EM | Admit: 2015-06-13 | Discharge: 2015-06-13 | Disposition: A | Discharge status: Home / Self Care | Payer: BLUE CROSS/BLUE SHIELD | Attending: Emergency Medicine | Admitting: Emergency Medicine

## 2015-06-13 ENCOUNTER — Emergency Department (HOSPITAL_BASED_OUTPATIENT_CLINIC_OR_DEPARTMENT_OTHER): Payer: BLUE CROSS/BLUE SHIELD

## 2015-06-13 DIAGNOSIS — G8929 Other chronic pain: Secondary | ICD-10-CM | POA: Insufficient documentation

## 2015-06-13 DIAGNOSIS — M546 Pain in thoracic spine: Secondary | ICD-10-CM | POA: Diagnosis not present

## 2015-06-13 DIAGNOSIS — M549 Dorsalgia, unspecified: Secondary | ICD-10-CM | POA: Diagnosis present

## 2015-06-13 DIAGNOSIS — Z87828 Personal history of other (healed) physical injury and trauma: Secondary | ICD-10-CM | POA: Diagnosis not present

## 2015-06-13 DIAGNOSIS — Z720 Tobacco use: Secondary | ICD-10-CM | POA: Diagnosis not present

## 2015-06-13 DIAGNOSIS — R51 Headache: Secondary | ICD-10-CM | POA: Insufficient documentation

## 2015-06-13 DIAGNOSIS — M25511 Pain in right shoulder: Secondary | ICD-10-CM | POA: Insufficient documentation

## 2015-06-13 DIAGNOSIS — R519 Headache, unspecified: Secondary | ICD-10-CM

## 2015-06-13 DIAGNOSIS — M25519 Pain in unspecified shoulder: Secondary | ICD-10-CM

## 2015-06-13 MED ORDER — SODIUM CHLORIDE 0.9 % IV BOLUS (SEPSIS)
1000.0000 mL | Freq: Once | INTRAVENOUS | Status: AC
  Administered 2015-06-13: 1000 mL via INTRAVENOUS

## 2015-06-13 MED ORDER — KETOROLAC TROMETHAMINE 30 MG/ML IJ SOLN
30.0000 mg | Freq: Once | INTRAMUSCULAR | Status: AC
  Administered 2015-06-13: 30 mg via INTRAVENOUS
  Filled 2015-06-13: qty 1

## 2015-06-13 MED ORDER — BUTALBITAL-APAP-CAFFEINE 50-325-40 MG PO TABS: 1.0000 | ORAL_TABLET | Freq: Four times a day (QID) | ORAL | Status: AC | PRN

## 2015-06-13 MED ORDER — IBUPROFEN 800 MG PO TABS: 800.0000 mg | ORAL_TABLET | Freq: Three times a day (TID) | ORAL | Status: DC

## 2015-06-13 MED ORDER — PROCHLORPERAZINE EDISYLATE 5 MG/ML IJ SOLN
10.0000 mg | Freq: Once | INTRAMUSCULAR | Status: AC
  Administered 2015-06-13: 10 mg via INTRAVENOUS
  Filled 2015-06-13: qty 2

## 2015-06-13 NOTE — Discharge Instructions (Signed)
Take the prescribed medication as directed. Follow-up with neurology regarding headaches-- call to make appt. Return to the ED for new or worsening symptoms.

## 2015-06-13 NOTE — ED Notes (Signed)
C/o mid back pain x 1 week-pain to right shoulder x 1 week-no injury-HA x 1 month-pt NAD, steady gait
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
## 2015-09-28 ENCOUNTER — Emergency Department (HOSPITAL_BASED_OUTPATIENT_CLINIC_OR_DEPARTMENT_OTHER)
Admission: EM | Admit: 2015-09-28 | Discharge: 2015-09-28 | Disposition: A | Discharge status: Home / Self Care | Payer: BLUE CROSS/BLUE SHIELD | Attending: Emergency Medicine | Admitting: Emergency Medicine

## 2015-09-28 ENCOUNTER — Emergency Department (HOSPITAL_BASED_OUTPATIENT_CLINIC_OR_DEPARTMENT_OTHER): Payer: BLUE CROSS/BLUE SHIELD

## 2015-09-28 ENCOUNTER — Encounter (HOSPITAL_BASED_OUTPATIENT_CLINIC_OR_DEPARTMENT_OTHER): Payer: Self-pay | Admitting: Emergency Medicine

## 2015-09-28 DIAGNOSIS — Z720 Tobacco use: Secondary | ICD-10-CM | POA: Insufficient documentation

## 2015-09-28 DIAGNOSIS — G8929 Other chronic pain: Secondary | ICD-10-CM | POA: Insufficient documentation

## 2015-09-28 DIAGNOSIS — Z5189 Encounter for other specified aftercare: Secondary | ICD-10-CM

## 2015-09-28 DIAGNOSIS — Z8659 Personal history of other mental and behavioral disorders: Secondary | ICD-10-CM | POA: Insufficient documentation

## 2015-09-28 DIAGNOSIS — Z791 Long term (current) use of non-steroidal anti-inflammatories (NSAID): Secondary | ICD-10-CM | POA: Insufficient documentation

## 2015-09-28 DIAGNOSIS — Z87828 Personal history of other (healed) physical injury and trauma: Secondary | ICD-10-CM | POA: Insufficient documentation

## 2015-09-28 DIAGNOSIS — Z4801 Encounter for change or removal of surgical wound dressing: Secondary | ICD-10-CM | POA: Insufficient documentation

## 2015-09-28 HISTORY — DX: Accidental discharge from unspecified firearms or gun, initial encounter: W34.00XA

## 2015-09-28 HISTORY — DX: Depression, unspecified: F32.A

## 2015-09-28 HISTORY — DX: Other chronic pain: G89.29

## 2015-09-28 HISTORY — DX: Major depressive disorder, single episode, unspecified: F32.9

## 2015-09-28 HISTORY — DX: Anxiety disorder, unspecified: F41.9

## 2015-09-28 MED ORDER — CEPHALEXIN 500 MG PO CAPS: 500.0000 mg | ORAL_CAPSULE | Freq: Three times a day (TID) | ORAL | Status: DC

## 2015-09-28 MED ORDER — LIDOCAINE HCL 2 % IJ SOLN
5.0000 mL | Freq: Once | INTRAMUSCULAR | Status: AC
  Administered 2015-09-28: 100 mg
  Filled 2015-09-28: qty 20

## 2015-09-28 NOTE — ED Notes (Signed)
Returned to room from xray
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
## 2015-09-28 NOTE — ED Notes (Signed)
Pt discharged by provider.

## 2015-09-28 NOTE — ED Notes (Signed)
Areas (x 4) appear slightly red in color, sm amt of drainage noted at ant sites. Denies having fever. Has some itching at sites per pt statement

## 2015-09-28 NOTE — Discharge Instructions (Signed)
You were evaluated in the ED today for your wound and there does not appear to be any emergent symptoms at this time. Please take your antibiotics as prescribed. Return to ED or other medical facility in 3 or 4 days to have sutures removed. Follow [facility] with [facility] in order to establish primary care and for further evaluation and management of your symptoms. Return to ED for worsening symptoms.  Wound Care Taking care of your wound properly can help to prevent pain and infection. It can also help your wound to heal more quickly.  HOW TO CARE FOR YOUR WOUND  Take or apply over-the-counter and prescription medicines only as told by your health care provider.  If you were prescribed antibiotic medicine, take or apply it as told by your health care provider. Do not stop using the antibiotic even if your condition improves.  Clean the wound each day or as told by your health care provider.  Wash the wound with mild soap and water.  Rinse the wound with water to remove all soap.  Pat the wound dry with a clean towel. Do not rub it.  There are many different ways to close and cover a wound. For example, a wound can be covered with stitches (sutures), skin glue, or adhesive strips. Follow instructions from your health care provider about:  How to take care of your wound.  When and how you should change your bandage (dressing).  When you should remove your dressing.  Removing whatever was used to close your wound.  Check your wound every day for signs of infection. Watch for:  Redness, swelling, or pain.  Fluid, blood, or pus.  Keep the dressing dry until your health care provider says it can be removed. Do not take baths, swim, use a hot tub, or do anything that would put your wound underwater until your health care provider approves.  Raise (elevate) the injured area above the level of your heart while you are sitting or lying down.  Do not scratch or  pick at the wound.  Keep all follow-up visits as told by your health care provider. This is important. SEEK MEDICAL CARE IF:  You received a tetanus shot and you have swelling, severe pain, redness, or bleeding at the injection site.  You have a fever.  Your pain is not controlled with medicine.  You have increased redness, swelling, or pain at the site of your wound.  You have fluid, blood, or pus coming from your wound.  You notice a bad smell coming from your wound or your dressing. SEEK IMMEDIATE MEDICAL CARE IF:  You have a red streak going away from your wound.   This information is not intended to replace advice given to you by your health care provider. Make sure you discuss any questions you have with your health care provider.   Document Released: 09/14/2008 Document Revised: 04/22/2015 Document Reviewed: 12/02/2014 Elsevier Interactive Patient Education [employer]

## 2015-09-28 NOTE — ED Notes (Signed)
Pt is getting an xray, is not up for discharge at this time.
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
## 2015-09-28 NOTE — ED Notes (Signed)
Pt not in room, pt in xray.  
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
## 2015-09-28 NOTE — ED Notes (Signed)
Patient transported to X-ray 

## 2015-09-28 NOTE — ED Notes (Signed)
Mayme Genta, [place] is going to perform a nerve block for patient prior to her leaving.  Pt verbalizes dc instructions from nurse and denies any further needs at this time.
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
## 2017-08-29 ENCOUNTER — Encounter (HOSPITAL_BASED_OUTPATIENT_CLINIC_OR_DEPARTMENT_OTHER): Payer: Self-pay | Admitting: *Deleted

## 2017-08-29 DIAGNOSIS — Y929 Unspecified place or not applicable: Secondary | ICD-10-CM | POA: Insufficient documentation

## 2017-08-29 DIAGNOSIS — Y999 Unspecified external cause status: Secondary | ICD-10-CM | POA: Insufficient documentation

## 2017-08-29 DIAGNOSIS — W25XXXA Contact with sharp glass, initial encounter: Secondary | ICD-10-CM | POA: Insufficient documentation

## 2017-08-29 DIAGNOSIS — Y939 Activity, unspecified: Secondary | ICD-10-CM | POA: Insufficient documentation

## 2017-08-29 DIAGNOSIS — F17200 Nicotine dependence, unspecified, uncomplicated: Secondary | ICD-10-CM | POA: Insufficient documentation

## 2017-08-29 DIAGNOSIS — S71112A Laceration without foreign body, left thigh, initial encounter: Secondary | ICD-10-CM | POA: Insufficient documentation

## 2017-08-29 DIAGNOSIS — Z23 Encounter for immunization: Secondary | ICD-10-CM | POA: Insufficient documentation

## 2017-08-29 NOTE — ED Triage Notes (Signed)
Pt c/o lac to left thigh from broken porcelain soap dish

## 2017-08-30 ENCOUNTER — Emergency Department (HOSPITAL_BASED_OUTPATIENT_CLINIC_OR_DEPARTMENT_OTHER)
Admission: EM | Admit: 2017-08-30 | Discharge: 2017-08-30 | Disposition: A | Discharge status: Home / Self Care | Payer: BLUE CROSS/BLUE SHIELD | Attending: Emergency Medicine | Admitting: Emergency Medicine

## 2017-08-30 DIAGNOSIS — S81812A Laceration without foreign body, left lower leg, initial encounter: Secondary | ICD-10-CM

## 2017-08-30 MED ORDER — TETANUS-DIPHTH-ACELL PERTUSSIS 5-2.5-18.5 LF-MCG/0.5 IM SUSP
0.5000 mL | Freq: Once | INTRAMUSCULAR | Status: AC
  Administered 2017-08-30: 0.5 mL via INTRAMUSCULAR
  Filled 2017-08-30: qty 0.5

## 2017-08-30 MED ORDER — LIDOCAINE-EPINEPHRINE (PF) 2 %-1:200000 IJ SOLN
10.0000 mL | Freq: Once | INTRAMUSCULAR | Status: AC
  Administered 2017-08-30: 10 mL
  Filled 2017-08-30: qty 20
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
## 2017-08-30 NOTE — Discharge Instructions (Signed)
Treatment: Keep your wound dry and dressing applied until this time tomorrow. After 24 hours, you may wash with warm soapy water. Dry and apply antibiotic ointment and clean dressing. Do this daily until your sutures are removed. ° °Follow-up: Please follow-up with your primary care provider or return to emergency department in 10 days for suture removal. Be aware of signs of infection: fever, increasing pain, redness, swelling, drainage from the area. Please call your primary care provider or return to emergency department if you develop any of these symptoms or if any of the sutures come out prior to removal. Please return to the emergency department if you develop any other new or worsening symptoms. ° °

## 2017-08-30 NOTE — ED Notes (Signed)
Pt verbalizes understanding of d/c instructions and denies any further needs at this time. 

## 2021-07-09 ENCOUNTER — Other Ambulatory Visit: Payer: Self-pay

## 2021-07-09 ENCOUNTER — Encounter (HOSPITAL_BASED_OUTPATIENT_CLINIC_OR_DEPARTMENT_OTHER): Payer: Self-pay | Admitting: *Deleted

## 2021-07-09 ENCOUNTER — Emergency Department (HOSPITAL_BASED_OUTPATIENT_CLINIC_OR_DEPARTMENT_OTHER)
Admission: EM | Admit: 2021-07-09 | Discharge: 2021-07-09 | Disposition: A | Discharge status: Home / Self Care | Payer: Medicaid Other | Attending: Emergency Medicine | Admitting: Emergency Medicine

## 2021-07-09 DIAGNOSIS — R21 Rash and other nonspecific skin eruption: Secondary | ICD-10-CM | POA: Diagnosis not present

## 2021-07-09 DIAGNOSIS — F17210 Nicotine dependence, cigarettes, uncomplicated: Secondary | ICD-10-CM | POA: Insufficient documentation

## 2021-07-09 MED ORDER — MUPIROCIN CALCIUM 2 % EX CREA: 1.0000 "application " | TOPICAL_CREAM | Freq: Two times a day (BID) | CUTANEOUS | 0 refills | Status: AC

## 2021-07-09 MED ORDER — DOXYCYCLINE HYCLATE 100 MG PO CAPS: 100.0000 mg | ORAL_CAPSULE | Freq: Two times a day (BID) | ORAL | 0 refills | Status: AC
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
## 2021-07-09 NOTE — ED Triage Notes (Signed)
Open sores covering her skin x 4 days. Painful and spreading per pt.

## 2021-07-09 NOTE — Discharge Instructions (Addendum)
Apply mupirocin ointment to wounds.  Sure to clean area with a clean cloth before each application and washcloth after each use. Take doxycycline as prescribed and complete the full course. Use of pHisoDerm soap once weekly. Recheck with your primary care provider.

## 2021-07-09 NOTE — ED Notes (Signed)
Did not obtain signature d/t open sores on hands to prevent spread. Verbal understanding of discharge instructions, prescriptions reviewed and sent electronically to pharmacy
